# Patient Record
Sex: Male | Born: 1975
Health system: Southern US, Community
[De-identification: ages and names within clinical notes are randomized; demographics above are authoritative.]

---

## 2014-02-11 ENCOUNTER — Inpatient Hospital Stay (HOSPITAL_COMMUNITY)
Admission: EM | Admit: 2014-02-11 | Discharge: 2014-02-13 | DRG: 340 | Disposition: A | Discharge status: Home / Self Care | Payer: Self-pay | Attending: General Surgery | Admitting: General Surgery

## 2014-02-11 ENCOUNTER — Encounter (HOSPITAL_COMMUNITY): Admission: EM | Disposition: A | Discharge status: Home / Self Care | Payer: Self-pay | Source: Home / Self Care

## 2014-02-11 ENCOUNTER — Emergency Department (HOSPITAL_COMMUNITY): Payer: Self-pay

## 2014-02-11 ENCOUNTER — Encounter (HOSPITAL_COMMUNITY): Payer: Self-pay | Admitting: Emergency Medicine

## 2014-02-11 DIAGNOSIS — K3580 Unspecified acute appendicitis: Secondary | ICD-10-CM

## 2014-02-11 DIAGNOSIS — F17200 Nicotine dependence, unspecified, uncomplicated: Secondary | ICD-10-CM | POA: Diagnosis present

## 2014-02-11 DIAGNOSIS — K3533 Acute appendicitis with perforation and localized peritonitis, with abscess: Principal | ICD-10-CM | POA: Diagnosis present

## 2014-02-11 HISTORY — PX: LAPAROSCOPIC APPENDECTOMY: SHX408

## 2014-02-11 LAB — COMPREHENSIVE METABOLIC PANEL
ALT: 7 U/L (ref 0–53)
AST: 12 U/L (ref 0–37)
Albumin: 3.6 g/dL (ref 3.5–5.2)
Alkaline Phosphatase: 56 U/L (ref 39–117)
Anion gap: 12 (ref 5–15)
BUN: 6 mg/dL (ref 6–23)
CO2: 23 meq/L (ref 19–32)
Calcium: 8.9 mg/dL (ref 8.4–10.5)
Chloride: 101 mEq/L (ref 96–112)
Creatinine, Ser: 1.13 mg/dL (ref 0.50–1.35)
GFR calc Af Amer: >90 mL/min (ref >90)
GFR, EST NON AFRICAN AMERICAN: 82 mL/min — AB (ref >90)
Glucose, Bld: 101 mg/dL — ABNORMAL HIGH (ref 70–99)
Potassium: 3.4 mEq/L — ABNORMAL LOW (ref 3.7–5.3)
SODIUM: 136 meq/L — AB (ref 137–147)
TOTAL PROTEIN: 7 g/dL (ref 6.0–8.3)
Total Bilirubin: 1.7 mg/dL — ABNORMAL HIGH (ref 0.3–1.2)

## 2014-02-11 LAB — CBC WITH DIFFERENTIAL/PLATELET
BASOS ABS: 0 10*3/uL (ref 0.0–0.1)
Basophils Relative: 0 % (ref 0–1)
Eosinophils Absolute: 0 10*3/uL (ref 0.0–0.7)
Eosinophils Relative: 0 % (ref 0–5)
HCT: 39.4 % (ref 39.0–52.0)
Hemoglobin: 13.8 g/dL (ref 13.0–17.0)
LYMPHS PCT: 13 % (ref 12–46)
Lymphs Abs: 1.6 10*3/uL (ref 0.7–4.0)
MCH: 31.3 pg (ref 26.0–34.0)
MCHC: 35 g/dL (ref 30.0–36.0)
MCV: 89.3 fL (ref 78.0–100.0)
Monocytes Absolute: 1.2 10*3/uL — ABNORMAL HIGH (ref 0.1–1.0)
Monocytes Relative: 9 % (ref 3–12)
Neutro Abs: 10.2 10*3/uL — ABNORMAL HIGH (ref 1.7–7.7)
Neutrophils Relative %: 78 % — ABNORMAL HIGH (ref 43–77)
Platelets: 216 10*3/uL (ref 150–400)
RBC: 4.41 MIL/uL (ref 4.22–5.81)
RDW: 12.3 % (ref 11.5–15.5)
WBC: 13.1 10*3/uL — AB (ref 4.0–10.5)

## 2014-02-11 LAB — URINALYSIS, ROUTINE W REFLEX MICROSCOPIC
Bilirubin Urine: NEGATIVE
GLUCOSE, UA: NEGATIVE mg/dL
HGB URINE DIPSTICK: NEGATIVE
Ketones, ur: 40 mg/dL — AB
LEUKOCYTES UA: NEGATIVE
Nitrite: NEGATIVE
PH: 6 (ref 5.0–8.0)
Protein, ur: NEGATIVE mg/dL
SPECIFIC GRAVITY, URINE: 1.009 (ref 1.005–1.030)
Urobilinogen, UA: 1 mg/dL (ref 0.0–1.0)

## 2014-02-11 SURGERY — APPENDECTOMY, LAPAROSCOPIC
Anesthesia: General | Site: Abdomen

## 2014-02-11 MED ORDER — ONDANSETRON HCL 4 MG/2ML IJ SOLN
4.0000 mg | Freq: Once | INTRAMUSCULAR | Status: AC
  Administered 2014-02-11: 4 mg via INTRAVENOUS
  Filled 2014-02-11: qty 2

## 2014-02-11 MED ORDER — NEOSTIGMINE METHYLSULFATE 10 MG/10ML IV SOLN
INTRAVENOUS | Status: AC
  Filled 2014-02-11: qty 1

## 2014-02-11 MED ORDER — GLYCOPYRROLATE 0.2 MG/ML IJ SOLN
INTRAMUSCULAR | Status: AC
  Filled 2014-02-11: qty 2

## 2014-02-11 MED ORDER — ROCURONIUM BROMIDE 50 MG/5ML IV SOLN
INTRAVENOUS | Status: AC
  Filled 2014-02-11: qty 1

## 2014-02-11 MED ORDER — SUFENTANIL CITRATE 50 MCG/ML IV SOLN
INTRAVENOUS | Status: AC
  Filled 2014-02-11: qty 1

## 2014-02-11 MED ORDER — ACETAMINOPHEN 650 MG RE SUPP
650.0000 mg | Freq: Four times a day (QID) | RECTAL | Status: DC | PRN
Start: 2014-02-11 — End: 2014-02-13
  Filled 2014-02-11: qty 1

## 2014-02-11 MED ORDER — ONDANSETRON HCL 4 MG/2ML IJ SOLN
4.0000 mg | Freq: Four times a day (QID) | INTRAMUSCULAR | Status: DC | PRN
  Filled 2014-02-11: qty 2

## 2014-02-11 MED ORDER — ONDANSETRON HCL 4 MG/2ML IJ SOLN
INTRAMUSCULAR | Status: AC
  Filled 2014-02-11: qty 2

## 2014-02-11 MED ORDER — OXYCODONE HCL 5 MG PO TABS
5.0000 mg | ORAL_TABLET | ORAL | Status: DC | PRN
  Administered 2014-02-12: 5 mg via ORAL
  Filled 2014-02-11: qty 1

## 2014-02-11 MED ORDER — ACETAMINOPHEN 325 MG PO TABS
650.0000 mg | ORAL_TABLET | Freq: Four times a day (QID) | ORAL | Status: DC | PRN
Start: 2014-02-11 — End: 2014-02-13
  Filled 2014-02-11: qty 2

## 2014-02-11 MED ORDER — ENOXAPARIN SODIUM 40 MG/0.4ML ~~LOC~~ SOLN
40.0000 mg | SUBCUTANEOUS | Status: DC
  Administered 2014-02-12 – 2014-02-13 (×2): 40 mg via SUBCUTANEOUS
  Filled 2014-02-11 (×4): qty 0.4

## 2014-02-11 MED ORDER — SODIUM CHLORIDE 0.9 % IJ SOLN
INTRAMUSCULAR | Status: AC
  Filled 2014-02-11: qty 10

## 2014-02-11 MED ORDER — MORPHINE SULFATE 4 MG/ML IJ SOLN
4.0000 mg | Freq: Once | INTRAMUSCULAR | Status: AC
  Administered 2014-02-11: 4 mg via INTRAVENOUS
  Filled 2014-02-11: qty 1

## 2014-02-11 MED ORDER — SUCCINYLCHOLINE CHLORIDE 20 MG/ML IJ SOLN
INTRAMUSCULAR | Status: AC
  Filled 2014-02-11: qty 1

## 2014-02-11 MED ORDER — LIDOCAINE HCL (CARDIAC) 20 MG/ML IV SOLN
INTRAVENOUS | Status: AC
  Filled 2014-02-11: qty 5

## 2014-02-11 MED ORDER — HYDROMORPHONE HCL PF 1 MG/ML IJ SOLN: 1.0000 mg | INTRAMUSCULAR | Status: DC | PRN

## 2014-02-11 MED ORDER — DEXTROSE-NACL 5-0.9 % IV SOLN
INTRAVENOUS | Status: DC
  Administered 2014-02-12 (×2): via INTRAVENOUS

## 2014-02-11 MED ORDER — DEXTROSE 5 % IV SOLN
2.0000 g | INTRAVENOUS | Status: DC
  Filled 2014-02-11: qty 2

## 2014-02-11 MED ORDER — MIDAZOLAM HCL 2 MG/2ML IJ SOLN
INTRAMUSCULAR | Status: AC
  Filled 2014-02-11: qty 2

## 2014-02-11 MED ORDER — BUPIVACAINE-EPINEPHRINE (PF) 0.5% -1:200000 IJ SOLN
INTRAMUSCULAR | Status: AC
  Filled 2014-02-11: qty 30

## 2014-02-11 MED ORDER — PROPOFOL 10 MG/ML IV BOLUS
INTRAVENOUS | Status: AC
Start: 2014-02-11 — End: 2014-02-11
  Filled 2014-02-11: qty 20

## 2014-02-11 MED ORDER — SODIUM CHLORIDE 0.9 % IV BOLUS (SEPSIS)
1000.0000 mL | Freq: Once | INTRAVENOUS | Status: AC
  Administered 2014-02-11: 1000 mL via INTRAVENOUS

## 2014-02-11 MED ORDER — IOHEXOL 300 MG/ML  SOLN
100.0000 mL | Freq: Once | INTRAMUSCULAR | Status: AC | PRN
  Administered 2014-02-11: 100 mL via INTRAVENOUS

## 2014-02-11 SURGICAL SUPPLY — 41 items
APPLIER CLIP ROT 10 11.4 M/L (STAPLE)
BLADE SURG ROTATE 9660 (MISCELLANEOUS) ×3 IMPLANT
CANISTER SUCTION 2500CC (MISCELLANEOUS) ×3 IMPLANT
CHLORAPREP W/TINT 26ML (MISCELLANEOUS) ×3 IMPLANT
CLIP APPLIE ROT 10 11.4 M/L (STAPLE) IMPLANT
COVER SURGICAL LIGHT HANDLE (MISCELLANEOUS) ×3 IMPLANT
CUTTER FLEX LINEAR 45M (STAPLE) ×3 IMPLANT
DERMABOND ADHESIVE PROPEN (GAUZE/BANDAGES/DRESSINGS) ×2
DERMABOND ADVANCED (GAUZE/BANDAGES/DRESSINGS) ×2
DERMABOND ADVANCED .7 DNX12 (GAUZE/BANDAGES/DRESSINGS) ×1 IMPLANT
DERMABOND ADVANCED .7 DNX6 (GAUZE/BANDAGES/DRESSINGS) ×1 IMPLANT
DRAPE UTILITY 15X26 W/TAPE STR (DRAPE) ×6 IMPLANT
DRAPE WARM FLUID 44X44 (DRAPE) ×3 IMPLANT
ELECT REM PT RETURN 9FT ADLT (ELECTROSURGICAL) ×3
ELECTRODE REM PT RTRN 9FT ADLT (ELECTROSURGICAL) ×1 IMPLANT
ENDOLOOP SUT PDS II  0 18 (SUTURE)
ENDOLOOP SUT PDS II 0 18 (SUTURE) IMPLANT
GLOVE BIO SURGEON STRL SZ8 (GLOVE) ×3 IMPLANT
GLOVE BIOGEL PI IND STRL 8 (GLOVE) ×1 IMPLANT
GLOVE BIOGEL PI INDICATOR 8 (GLOVE) ×2
GOWN STRL REUS W/ TWL LRG LVL3 (GOWN DISPOSABLE) ×1 IMPLANT
GOWN STRL REUS W/ TWL XL LVL3 (GOWN DISPOSABLE) ×1 IMPLANT
GOWN STRL REUS W/TWL LRG LVL3 (GOWN DISPOSABLE) ×2
GOWN STRL REUS W/TWL XL LVL3 (GOWN DISPOSABLE) ×2
KIT BASIN OR (CUSTOM PROCEDURE TRAY) ×3 IMPLANT
KIT ROOM TURNOVER OR (KITS) ×3 IMPLANT
NS IRRIG 1000ML POUR BTL (IV SOLUTION) ×3 IMPLANT
PAD ARMBOARD 7.5X6 YLW CONV (MISCELLANEOUS) ×6 IMPLANT
POUCH SPECIMEN RETRIEVAL 10MM (ENDOMECHANICALS) ×3 IMPLANT
RELOAD STAPLE TA45 3.5 REG BLU (ENDOMECHANICALS) ×3 IMPLANT
SCALPEL HARMONIC ACE (MISCELLANEOUS) ×3 IMPLANT
SET IRRIG TUBING LAPAROSCOPIC (IRRIGATION / IRRIGATOR) ×3 IMPLANT
SPECIMEN JAR SMALL (MISCELLANEOUS) ×3 IMPLANT
SUT MON AB 4-0 PC3 18 (SUTURE) ×3 IMPLANT
TOWEL OR 17X24 6PK STRL BLUE (TOWEL DISPOSABLE) ×3 IMPLANT
TOWEL OR 17X26 10 PK STRL BLUE (TOWEL DISPOSABLE) ×3 IMPLANT
TRAY FOLEY CATH 16FR SILVER (SET/KITS/TRAYS/PACK) ×3 IMPLANT
TRAY LAPAROSCOPIC (CUSTOM PROCEDURE TRAY) ×3 IMPLANT
TROCAR XCEL BLADELESS 5X75MML (TROCAR) ×6 IMPLANT
TROCAR XCEL BLUNT TIP 100MML (ENDOMECHANICALS) ×3 IMPLANT
TUBING INSUFF HIGH FLOW RTP (TUBING) ×3 IMPLANT

## 2014-02-11 NOTE — ED Notes (Signed)
Patient returned from CT

## 2014-02-11 NOTE — H&P (Signed)
Wayne Chang is an 38 y.o. male.   Chief Complaint: RLQ abdominal pain   HPI: pt presents with 3 day hx of abdominal pain.  Started as diffuse crampy pain now is located in RLQ and is constant crampy 8/10 made worse with movement.  Feel week and has malaise.  NO BACK PAIN OR DYSURIA.Marland Kitchen  CT shows appendicitis with possible perforation.    History reviewed. No pertinent past medical history.  History reviewed. No pertinent past surgical history.  History reviewed. No pertinent family history. Social History:  reports that he has been smoking Cigarettes.  He has been smoking about 0.00 packs per day. He does not have any smokeless tobacco history on file. He reports that he uses illicit drugs (Marijuana). He reports that he does not drink alcohol.  Allergies:  Allergies  Allergen Reactions  . Peanut-Containing Drug Products      (Not in a hospital admission)  Results for orders placed during the hospital encounter of 02/11/14 (from the past 48 hour(s))  CBC WITH DIFFERENTIAL     Status: Abnormal   Collection Time    02/11/14  7:07 PM      Result Value Ref Range   WBC 13.1 (*) 4.0 - 10.5 K/uL   RBC 4.41  4.22 - 5.81 MIL/uL   Hemoglobin 13.8  13.0 - 17.0 g/dL   HCT 39.4  39.0 - 52.0 %   MCV 89.3  78.0 - 100.0 fL   MCH 31.3  26.0 - 34.0 pg   MCHC 35.0  30.0 - 36.0 g/dL   RDW 12.3  11.5 - 15.5 %   Platelets 216  150 - 400 K/uL   Neutrophils Relative % 78 (*) 43 - 77 %   Neutro Abs 10.2 (*) 1.7 - 7.7 K/uL   Lymphocytes Relative 13  12 - 46 %   Lymphs Abs 1.6  0.7 - 4.0 K/uL   Monocytes Relative 9  3 - 12 %   Monocytes Absolute 1.2 (*) 0.1 - 1.0 K/uL   Eosinophils Relative 0  0 - 5 %   Eosinophils Absolute 0.0  0.0 - 0.7 K/uL   Basophils Relative 0  0 - 1 %   Basophils Absolute 0.0  0.0 - 0.1 K/uL  COMPREHENSIVE METABOLIC PANEL     Status: Abnormal   Collection Time    02/11/14  7:07 PM      Result Value Ref Range   Sodium 136 (*) 137 - 147 mEq/L   Potassium 3.4 (*) 3.7  - 5.3 mEq/L   Chloride 101  96 - 112 mEq/L   CO2 23  19 - 32 mEq/L   Glucose, Bld 101 (*) 70 - 99 mg/dL   BUN 6  6 - 23 mg/dL   Creatinine, Ser 1.13  0.50 - 1.35 mg/dL   Calcium 8.9  8.4 - 10.5 mg/dL   Total Protein 7.0  6.0 - 8.3 g/dL   Albumin 3.6  3.5 - 5.2 g/dL   AST 12  0 - 37 U/L   ALT 7  0 - 53 U/L   Alkaline Phosphatase 56  39 - 117 U/L   Total Bilirubin 1.7 (*) 0.3 - 1.2 mg/dL   GFR calc non Af Amer 82 (*) >90 mL/min   GFR calc Af Amer >90  >90 mL/min   Comment: (NOTE)     The eGFR has been calculated using the CKD EPI equation.     This calculation has not been validated in all  clinical situations.     eGFR's persistently <90 mL/min signify possible Chronic Kidney     Disease.   Anion gap 12  5 - 15  URINALYSIS, ROUTINE W REFLEX MICROSCOPIC     Status: Abnormal   Collection Time    02/11/14  9:04 PM      Result Value Ref Range   Color, Urine YELLOW  YELLOW   APPearance CLEAR  CLEAR   Specific Gravity, Urine 1.009  1.005 - 1.030   pH 6.0  5.0 - 8.0   Glucose, UA NEGATIVE  NEGATIVE mg/dL   Hgb urine dipstick NEGATIVE  NEGATIVE   Bilirubin Urine NEGATIVE  NEGATIVE   Ketones, ur 40 (*) NEGATIVE mg/dL   Protein, ur NEGATIVE  NEGATIVE mg/dL   Urobilinogen, UA 1.0  0.0 - 1.0 mg/dL   Nitrite NEGATIVE  NEGATIVE   Leukocytes, UA NEGATIVE  NEGATIVE   Comment: MICROSCOPIC NOT DONE ON URINES WITH NEGATIVE PROTEIN, BLOOD, LEUKOCYTES, NITRITE, OR GLUCOSE <1000 mg/dL.   Ct Abdomen Pelvis W Contrast  02/11/2014   CLINICAL DATA:  Lower abdominal pain and cramping.  EXAM: CT ABDOMEN AND PELVIS WITH CONTRAST  TECHNIQUE: Multidetector CT imaging of the abdomen and pelvis was performed using the standard protocol following bolus administration of intravenous contrast.  CONTRAST:  18m OMNIPAQUE IOHEXOL 300 MG/ML  SOLN  COMPARISON:  None.  FINDINGS: The patient has acute appendicitis with extensive periappendiceal soft tissue inflammation with a small amount of fluid in the right  pericolic gutter extending to the right side of the pelvis. There is a small fluid collection adjacent to the tip of inflamed appendix which may represent a small periappendiceal abscess.  Liver, biliary tree, spleen, pancreas, adrenal glands, and kidneys are normal. No dilated bowel. Bladder is normal. No acute osseous abnormality.  IMPRESSION: Acute appendicitis. There may be a 1 cm periappendiceal abscess adjacent to the tip of the appendix.   Electronically Signed   By: JRozetta NunneryM.D.   On: 02/11/2014 22:36    Review of Systems  Constitutional: Positive for malaise/fatigue.  HENT: Negative.   Eyes: Negative.   Respiratory: Negative.   Cardiovascular: Negative.   Gastrointestinal: Positive for nausea and abdominal pain.  Genitourinary: Negative.   Musculoskeletal: Negative.   Skin: Negative.   Neurological: Positive for weakness. Negative for dizziness and tingling.  Endo/Heme/Allergies: Negative.   Psychiatric/Behavioral: Negative.     Blood pressure 112/65, pulse 60, temperature 99.2 F (37.3 C), temperature source Oral, resp. rate 18, SpO2 98.00%. Physical Exam  Constitutional: He is oriented to person, place, and time. He appears well-developed and well-nourished.  HENT:  Head: Normocephalic and atraumatic.  Eyes: Pupils are equal, round, and reactive to light. No scleral icterus.  Neck: Normal range of motion.  Cardiovascular: Normal rate and regular rhythm.   Respiratory: Effort normal and breath sounds normal.  GI: There is tenderness in the right lower quadrant. There is rigidity, rebound, guarding and tenderness at McBurney's point.  Musculoskeletal: Normal range of motion.  Neurological: He is alert and oriented to person, place, and time.  Skin: Skin is warm and dry.  Psychiatric: He has a normal mood and affect. His behavior is normal. Judgment and thought content normal.     Assessment/Plan Acute appendicitis with possible 1 cm abscess Appears toxic Will  proceed to OR tonight for laparoscopic possible open  Appendectomy  Since pt appears quite ill and has been for the last 3 days.   The procedure has been discussed with  the patient.  Alternative therapies have been discussed with the patient.  Operative risks include bleeding,  Infection,  Organ injury,  Nerve injury,  Blood vessel injury,  DVT,  Pulmonary embolism,  Death,  And possible reoperation.  Medical management risks include worsening of present situation.  The success of the procedure is 50 -90 % at treating patients symptoms.  The patient understands and agrees to proceed.  Aylen Stradford A. 02/11/2014, 11:07 PM

## 2014-02-11 NOTE — ED Provider Notes (Signed)
CSN: [id number]     Arrival date & time 02/11/14  1851 History   First MD Initiated Contact with Patient 02/11/14 1939     Chief Complaint  Patient presents with  . Abdominal Pain     (Consider location/radiation/quality/duration/timing/severity/associated sxs/prior Treatment) HPI Comments: Patient is a 38 year old otherwise healthy male who presents to the ED for evaluation of 3 days of lower abdominal pain and cramping. He reports initially the pain was generalized and now is worse in his right lower quadrant. The pain acutely worsened today. He denies nausea, vomiting, diarrhea. He has loss of appetite, but notes increased polydipsia. He has associated chills.   Patient is a 38 y.o. male presenting with abdominal pain. The history is provided by the patient. No language interpreter was used.  Abdominal Pain Associated symptoms: chills   Associated symptoms: no chest pain, no constipation, no diarrhea, no dysuria, no fever, no nausea, no shortness of breath and no vomiting     History reviewed. No pertinent past medical history. History reviewed. No pertinent past surgical history. History reviewed. No pertinent family history. History  Substance Use Topics  . Smoking status: Current Every Day Smoker    Types: Cigarettes  . Smokeless tobacco: Not on file  . Alcohol Use: No    Review of Systems  Constitutional: Positive for chills. Negative for fever.  Respiratory: Negative for shortness of breath.   Cardiovascular: Negative for chest pain.  Gastrointestinal: Positive for abdominal pain. Negative for nausea, vomiting, diarrhea and constipation.  Endocrine: Positive for polydipsia.  Genitourinary: Negative for dysuria.  All other systems reviewed and are negative.     Allergies  Peanut-containing drug products  Home Medications   Prior to Admission medications   Not on File   BP 135/72  Pulse 90  Temp(Src) 99.2 F (37.3 C) (Oral)  Resp 18  SpO2 100% Physical  Exam  Nursing note and vitals reviewed. Constitutional: He is oriented to person, place, and time. He appears well-developed and well-nourished. No distress.  HENT:  Head: Normocephalic and atraumatic.  Right Ear: External ear normal.  Left Ear: External ear normal.  Nose: Nose normal.  Eyes: Conjunctivae are normal.  Neck: Normal range of motion. No tracheal deviation present.  Cardiovascular: Normal rate, regular rhythm and normal heart sounds.   Pulmonary/Chest: Effort normal and breath sounds normal. No stridor.  Abdominal: Soft. He exhibits no distension. There is tenderness in the right lower quadrant.  Musculoskeletal: Normal range of motion.  Neurological: He is alert and oriented to person, place, and time.  Skin: Skin is warm and dry. He is not diaphoretic.  Psychiatric: He has a normal mood and affect. His behavior is normal.    ED Course  Procedures (including critical care time) Labs Review Labs Reviewed  CBC WITH DIFFERENTIAL - Abnormal; Notable for the following:    WBC 13.1 (*)    Neutrophils Relative % 78 (*)    Neutro Abs 10.2 (*)    Monocytes Absolute 1.2 (*)    All other components within normal limits  COMPREHENSIVE METABOLIC PANEL - Abnormal; Notable for the following:    Sodium 136 (*)    Potassium 3.4 (*)    Glucose, Bld 101 (*)    Total Bilirubin 1.7 (*)    GFR calc non Af Amer 82 (*)    All other components within normal limits  URINALYSIS, ROUTINE W REFLEX MICROSCOPIC - Abnormal; Notable for the following:    Ketones, ur 40 (*)  All other components within normal limits    Imaging Review Ct Abdomen Pelvis W Contrast  02/11/2014   CLINICAL DATA:  Lower abdominal pain and cramping.  EXAM: CT ABDOMEN AND PELVIS WITH CONTRAST  TECHNIQUE: Multidetector CT imaging of the abdomen and pelvis was performed using the standard protocol following bolus administration of intravenous contrast.  CONTRAST:  OMNIPAQUE IOHEXOL 300 MG/ML  SOLN  COMPARISON:   None.  FINDINGS: The patient has acute appendicitis with extensive periappendiceal soft tissue inflammation with a small amount of fluid in the right pericolic gutter extending to the right side of the pelvis. There is a small fluid collection adjacent to the tip of inflamed appendix which may represent a small periappendiceal abscess.  Liver, biliary tree, spleen, pancreas, adrenal glands, and kidneys are normal. No dilated bowel. Bladder is normal. No acute osseous abnormality.  IMPRESSION: Acute appendicitis. There may be a 1 cm periappendiceal abscess adjacent to the tip of the appendix.   Electronically Signed   By: Geanie Cooley M.D.   On: 02/11/2014 22:36     EKG Interpretation None      MDM   Final diagnoses:  Acute appendicitis, unspecified acute appendicitis type   Patient presents emergency Department with acute appendicitis per CT scan. There may be a 1 cm periappendiceal abscess adjacent to the tip of the appendix. Patient is otherwise healthy. Hemodynamically stable. He last ate yesterday. He had some sips of water prior to arrival. Discussed case with Dr. Luisa Hart who will admit the patient. Consultation is appreciated. Patient / Family / Caregiver informed of clinical course, understand medical decision-making process, and agree with plan.    Mora Bellman, PA-C 02/11/14 2248

## 2014-02-11 NOTE — ED Provider Notes (Signed)
Medical screening examination/treatment/procedure(s) were performed by non-physician practitioner and as supervising physician I was immediately available for consultation/collaboration.   EKG Interpretation None       Kaisei Gilbo R. Batya Citron, MD 02/11/14 2316 

## 2014-02-11 NOTE — ED Notes (Signed)
Pt is aware he needs a urine sample 

## 2014-02-11 NOTE — ED Notes (Signed)
Notified CT; patient done with contrast. 

## 2014-02-11 NOTE — ED Notes (Addendum)
Pt reports having lower abd pain and cramping x 3 days. Denies n/v/d. Denies urinary symptoms.

## 2014-02-11 NOTE — ED Notes (Signed)
General Surgery, MD at bedside ([place]).

## 2014-02-11 NOTE — ED Notes (Signed)
Patient transported to CT 

## 2014-02-12 ENCOUNTER — Encounter (HOSPITAL_COMMUNITY): Payer: Self-pay | Admitting: Certified Registered"

## 2014-02-12 ENCOUNTER — Encounter (HOSPITAL_COMMUNITY): Payer: Self-pay | Admitting: Surgery

## 2014-02-12 ENCOUNTER — Observation Stay (HOSPITAL_COMMUNITY): Payer: Self-pay | Admitting: Certified Registered"

## 2014-02-12 DIAGNOSIS — K3533 Acute appendicitis with perforation and localized peritonitis, with abscess: Secondary | ICD-10-CM | POA: Diagnosis present

## 2014-02-12 MED ORDER — METRONIDAZOLE IN NACL 5-0.79 MG/ML-% IV SOLN
500.0000 mg | Freq: Three times a day (TID) | INTRAVENOUS | Status: DC
  Administered 2014-02-12 – 2014-02-13 (×4): 500 mg via INTRAVENOUS
  Filled 2014-02-12 (×8): qty 100

## 2014-02-12 MED ORDER — GLYCOPYRROLATE 0.2 MG/ML IJ SOLN
INTRAMUSCULAR | Status: DC | PRN
  Administered 2014-02-12: 0.4 mg via INTRAVENOUS
  Administered 2014-02-12: 0.2 mg via INTRAVENOUS

## 2014-02-12 MED ORDER — 0.9 % SODIUM CHLORIDE (POUR BTL) OPTIME
TOPICAL | Status: DC | PRN
  Administered 2014-02-12: 1000 mL

## 2014-02-12 MED ORDER — ONDANSETRON HCL 4 MG/2ML IJ SOLN: 4.0000 mg | Freq: Four times a day (QID) | INTRAMUSCULAR | Status: DC | PRN

## 2014-02-12 MED ORDER — PROPOFOL 10 MG/ML IV BOLUS
INTRAVENOUS | Status: DC | PRN
  Administered 2014-02-12: 150 mg via INTRAVENOUS

## 2014-02-12 MED ORDER — OXYCODONE HCL 5 MG PO TABS: 5.0000 mg | ORAL_TABLET | Freq: Once | ORAL | Status: DC | PRN

## 2014-02-12 MED ORDER — OXYCODONE HCL 5 MG/5ML PO SOLN: 5.0000 mg | Freq: Once | ORAL | Status: DC | PRN

## 2014-02-12 MED ORDER — BUPIVACAINE-EPINEPHRINE 0.5% -1:200000 IJ SOLN
INTRAMUSCULAR | Status: DC | PRN
  Administered 2014-02-12: 4 mL

## 2014-02-12 MED ORDER — NEOSTIGMINE METHYLSULFATE 10 MG/10ML IV SOLN
INTRAVENOUS | Status: DC | PRN
  Administered 2014-02-12: 3 mg via INTRAVENOUS

## 2014-02-12 MED ORDER — SODIUM CHLORIDE 0.9 % IR SOLN
Status: DC | PRN
  Administered 2014-02-12: 1000 mL

## 2014-02-12 MED ORDER — MIDAZOLAM HCL 5 MG/5ML IJ SOLN
INTRAMUSCULAR | Status: DC | PRN
  Administered 2014-02-12: 2 mg via INTRAVENOUS

## 2014-02-12 MED ORDER — LACTATED RINGERS IV SOLN
INTRAVENOUS | Status: DC | PRN
  Administered 2014-02-12 (×2): via INTRAVENOUS

## 2014-02-12 MED ORDER — LIDOCAINE HCL (CARDIAC) 20 MG/ML IV SOLN
INTRAVENOUS | Status: DC | PRN
  Administered 2014-02-12: 100 mg via INTRAVENOUS

## 2014-02-12 MED ORDER — DEXAMETHASONE SODIUM PHOSPHATE 4 MG/ML IJ SOLN
INTRAMUSCULAR | Status: DC | PRN
  Administered 2014-02-12: 4 mg via INTRAVENOUS

## 2014-02-12 MED ORDER — ROCURONIUM BROMIDE 100 MG/10ML IV SOLN
INTRAVENOUS | Status: DC | PRN
  Administered 2014-02-12: 30 mg via INTRAVENOUS

## 2014-02-12 MED ORDER — HYDROMORPHONE HCL PF 1 MG/ML IJ SOLN
INTRAMUSCULAR | Status: AC
  Filled 2014-02-12: qty 1

## 2014-02-12 MED ORDER — CEFOTETAN DISODIUM 2 G IJ SOLR
2.0000 g | INTRAMUSCULAR | Status: DC | PRN
  Administered 2014-02-12: 2 g via INTRAVENOUS

## 2014-02-12 MED ORDER — GLYCOPYRROLATE 0.2 MG/ML IJ SOLN
INTRAMUSCULAR | Status: AC
  Filled 2014-02-12: qty 1

## 2014-02-12 MED ORDER — SUCCINYLCHOLINE CHLORIDE 20 MG/ML IJ SOLN
INTRAMUSCULAR | Status: DC | PRN
  Administered 2014-02-12: 100 mg via INTRAVENOUS

## 2014-02-12 MED ORDER — SUFENTANIL CITRATE 50 MCG/ML IV SOLN
INTRAVENOUS | Status: DC | PRN
  Administered 2014-02-12: 20 ug via INTRAVENOUS
  Administered 2014-02-12 (×2): 10 ug via INTRAVENOUS

## 2014-02-12 MED ORDER — ONDANSETRON HCL 4 MG/2ML IJ SOLN
INTRAMUSCULAR | Status: DC | PRN
  Administered 2014-02-12: 4 mg via INTRAVENOUS

## 2014-02-12 MED ORDER — DEXTROSE 5 % IV SOLN
1.0000 g | INTRAVENOUS | Status: DC
  Administered 2014-02-12 – 2014-02-13 (×2): 1 g via INTRAVENOUS
  Filled 2014-02-12 (×2): qty 10

## 2014-02-12 MED ORDER — HYDROMORPHONE HCL PF 1 MG/ML IJ SOLN
0.2500 mg | INTRAMUSCULAR | Status: DC | PRN
  Administered 2014-02-12: 0.5 mg via INTRAVENOUS

## 2014-02-12 NOTE — Progress Notes (Signed)
Patient ID: Wayne Chang, male   DOB: 1975/08/19, 38 y.o.   MRN: [id number]     Wayne Chang      [address] [place], [place] [address]    Phone: [phone] FAX: [phone]     Subjective: No N/V.  No flatus.  Voiding.  Has not had anything PO.    Objective:  Vital signs:  Filed Vitals:   02/12/14 0222 02/12/14 0225 02/12/14 0239 02/12/14 0615  BP: 131/67 126/65 131/67 135/74  Pulse: 74 76 82 76  Temp: 99.2 F (37.3 C)  99.7 F (37.6 C) 99.4 F (37.4 C)  TempSrc:   Oral Oral  Resp: _0 Height:      Weight:      SpO2: 100% 100% 100% 100%       Intake/Output   Yesterday:  09/13 0701 - 09/14 0700 In: 1000 [I.V.:1000] Out: 100 [Urine:100] This shift:    I/O last 3 completed shifts: In: 1000 [I.V.:1000] Out: 100 [Urine:100]   Physical Exam: General: Pt awake/alert/oriented x4 in no acute distress Chest: cta.  No chest wall pain w good excursion CV:  Pulses intact.  Regular rhythm Abdomen: Soft.  Nondistended.  +BS.  Mildly tender at incisions only.  Incisions are c/d/i.  No evidence of peritonitis.  No incarcerated hernias. Ext: No mjr edema.  No cyanosis Skin: No petechiae / purpura   Problem List:   Active Problems:   Acute appendicitis with peritoneal abscess    Results:   Labs: Results for orders placed during the hospital encounter of 02/11/14 (from the past 48 hour(s))  CBC WITH DIFFERENTIAL     Status: Abnormal   Collection Time    02/11/14  7:07 PM      Result Value Ref Range   WBC 13.1 (*) 4.0 - 10.5 K/uL   RBC 4.41  4.22 - 5.81 MIL/uL   Hemoglobin 13.8  13.0 - 17.0 g/dL   HCT 39.4  39.0 - 52.0 %   MCV 89.3  78.0 - 100.0 fL   MCH 31.3  26.0 - 34.0 pg   MCHC 35.0  30.0 - 36.0 g/dL   RDW 12.3  11.5 - 15.5 %   Platelets 216  150 - 400 K/uL   Neutrophils Relative % 78 (*) 43 - 77 %   Neutro Abs 10.2 (*) 1.7 - 7.7 K/uL   Lymphocytes Relative 13  12 - 46 %   Lymphs Abs 1.6   0.7 - 4.0 K/uL   Monocytes Relative 9  3 - 12 %   Monocytes Absolute 1.2 (*) 0.1 - 1.0 K/uL   Eosinophils Relative 0  0 - 5 %   Eosinophils Absolute 0.0  0.0 - 0.7 K/uL   Basophils Relative 0  0 - 1 %   Basophils Absolute 0.0  0.0 - 0.1 K/uL  COMPREHENSIVE METABOLIC PANEL     Status: Abnormal   Collection Time    02/11/14  7:07 PM      Result Value Ref Range   Sodium 136 (*) 137 - 147 mEq/L   Potassium 3.4 (*) 3.7 - 5.3 mEq/L   Chloride 101  96 - 112 mEq/L   CO2 23  19 - 32 mEq/L   Glucose, Bld 101 (*) 70 - 99 mg/dL   BUN 6  6 - 23 mg/dL   Creatinine, Ser 1.13  0.50 - 1.35 mg/dL   Calcium 8.9  8.4 - 10.5 mg/dL  Total Protein 7.0  6.0 - 8.3 g/dL   Albumin 3.6  3.5 - 5.2 g/dL   AST 12  0 - 37 U/L   ALT 7  0 - 53 U/L   Alkaline Phosphatase 56  39 - 117 U/L   Total Bilirubin 1.7 (*) 0.3 - 1.2 mg/dL   GFR calc non Af Amer 82 (*) >90 mL/min   GFR calc Af Amer >90  >90 mL/min   Comment: (NOTE)     The eGFR has been calculated using the CKD EPI equation.     This calculation has not been validated in all clinical situations.     eGFR's persistently <90 mL/min signify possible Chronic Kidney     Disease.   Anion gap 12  5 - 15  URINALYSIS, ROUTINE W REFLEX MICROSCOPIC     Status: Abnormal   Collection Time    02/11/14  9:04 PM      Result Value Ref Range   Color, Urine YELLOW  YELLOW   APPearance CLEAR  CLEAR   Specific Gravity, Urine 1.009  1.005 - 1.030   pH 6.0  5.0 - 8.0   Glucose, UA NEGATIVE  NEGATIVE mg/dL   Hgb urine dipstick NEGATIVE  NEGATIVE   Bilirubin Urine NEGATIVE  NEGATIVE   Ketones, ur 40 (*) NEGATIVE mg/dL   Protein, ur NEGATIVE  NEGATIVE mg/dL   Urobilinogen, UA 1.0  0.0 - 1.0 mg/dL   Nitrite NEGATIVE  NEGATIVE   Leukocytes, UA NEGATIVE  NEGATIVE   Comment: MICROSCOPIC NOT DONE ON URINES WITH NEGATIVE PROTEIN, BLOOD, LEUKOCYTES, NITRITE, OR GLUCOSE <1000 mg/dL.    Imaging / Studies: Ct Abdomen Pelvis W Contrast  02/11/2014   CLINICAL DATA:  Lower  abdominal pain and cramping.  EXAM: CT ABDOMEN AND PELVIS WITH CONTRAST  TECHNIQUE: Multidetector CT imaging of the abdomen and pelvis was performed using the standard protocol following bolus administration of intravenous contrast.  CONTRAST:  153m OMNIPAQUE IOHEXOL 300 MG/ML  SOLN  COMPARISON:  None.  FINDINGS: The patient has acute appendicitis with extensive periappendiceal soft tissue inflammation with a small amount of fluid in the right pericolic gutter extending to the right side of the pelvis. There is a small fluid collection adjacent to the tip of inflamed appendix which may represent a small periappendiceal abscess.  Liver, biliary tree, spleen, pancreas, adrenal glands, and kidneys are normal. No dilated bowel. Bladder is normal. No acute osseous abnormality.  IMPRESSION: Acute appendicitis. There may be a 1 cm periappendiceal abscess adjacent to the tip of the appendix.   Electronically Signed   By: JRozetta NunneryM.D.   On: 02/11/2014 22:36    Medications / Allergies:  Scheduled Meds: . cefTRIAXone (ROCEPHIN)  IV  1 g Intravenous Q24H  . enoxaparin (LOVENOX) injection  40 mg Subcutaneous Q24H  . HYDROmorphone      . metronidazole  500 mg Intravenous Q8H   Continuous Infusions: . dextrose 5 % and 0.9% NaCl     PRN Meds:.acetaminophen, acetaminophen, HYDROmorphone (DILAUDID) injection, ondansetron, oxyCODONE  Antibiotics: Anti-infectives   Start     Dose/Rate Route Frequency Ordered Stop   02/12/14 0600  cefOXitin (MEFOXIN) 2 g in dextrose 5 % 50 mL IVPB  Status:  Discontinued     2 g 100 mL/hr over 30 Minutes Intravenous On call to O.R. 02/11/14 2304 02/12/14 0254   02/12/14 0200  cefTRIAXone (ROCEPHIN) 1 g in dextrose 5 % 50 mL IVPB     1 g 100 mL/hr  over 30 Minutes Intravenous Every 24 hours 02/12/14 0148     02/12/14 0200  metroNIDAZOLE (FLAGYL) IVPB 500 mg     500 mg 100 mL/hr over 60 Minutes Intravenous Every 8 hours 02/12/14 0148          Assessment/Plan POD#1  laparoscopic appendectomy with peritoneal abscess -start clears and advance as tolerated -mobilize/IS -rocephin/flagyl, 10 days of PO atbx at discharge -SLIV when able to tolerate PO -pain control -SCD/lovenox  -anticipate discharge tomorrow   Erby Pian, [facility] Surgery Pager [phone](7A-4:30P) For consults and floor pages call [phone](7A-4:30P)  02/12/2014 8:45 AM

## 2014-02-12 NOTE — Progress Notes (Signed)
Utilization review completed.  

## 2014-02-12 NOTE — Anesthesia Postprocedure Evaluation (Signed)
Anesthesia Post Note  Patient: Wayne Chang  Procedure(s) Performed: Procedure(s) (LRB): APPENDECTOMY LAPAROSCOPIC (N/A)  Anesthesia type: General  Patient location: PACU  Post pain: Pain level controlled and Adequate analgesia  Post assessment: Post-op Vital signs reviewed, Patient's Cardiovascular Status Stable, Respiratory Function Stable, Patent Airway and Pain level controlled  Last Vitals:  Filed Vitals:   02/12/14 0615  BP: 135/74  Pulse: 76  Temp: 37.4 C  Resp: 16    Post vital signs: Reviewed and stable  Level of consciousness: awake, alert  and oriented  Complications: No apparent anesthesia complications

## 2014-02-12 NOTE — Anesthesia Preprocedure Evaluation (Signed)
Anesthesia Evaluation  Patient identified by MRN, date of birth, ID band Patient awake    Reviewed: Allergy & Precautions, H&P , NPO status , Patient's Chart, lab work & pertinent test results  Airway Mallampati: II  Neck ROM: full    Dental   Pulmonary Current Smoker,          Cardiovascular negative cardio ROS      Neuro/Psych    GI/Hepatic   Endo/Other    Renal/GU      Musculoskeletal   Abdominal   Peds  Hematology   Anesthesia Other Findings   Reproductive/Obstetrics                           Anesthesia Physical Anesthesia Plan  ASA: II and emergent  Anesthesia Plan: General   Post-op Pain Management:    Induction: Intravenous  Airway Management Planned: Oral ETT  Additional Equipment:   Intra-op Plan:   Post-operative Plan: Extubation in OR  Informed Consent: I have reviewed the patients History and Physical, chart, labs and discussed the procedure including the risks, benefits and alternatives for the proposed anesthesia with the patient or authorized representative who has indicated his/her understanding and acceptance.     Plan Discussed with: CRNA, Anesthesiologist and Surgeon  Anesthesia Plan Comments:         Anesthesia Quick Evaluation

## 2014-02-12 NOTE — Transfer of Care (Signed)
Immediate Anesthesia Transfer of Care Note  Patient: Wayne Chang  Procedure(s) Performed: Procedure(s): APPENDECTOMY LAPAROSCOPIC (N/A)  Patient Location: PACU  Anesthesia Type:General  Level of Consciousness: oriented, sedated, patient cooperative and responds to stimulation  Airway & Oxygen Therapy: Patient Spontanous Breathing and Patient connected to nasal cannula oxygen  Post-op Assessment: Report given to PACU RN, Post -op Vital signs reviewed and stable and Patient moving all extremities X 4  Post vital signs: Reviewed and stable  Complications: No apparent anesthesia complications

## 2014-02-12 NOTE — Progress Notes (Signed)
Needs 5-7 total days abx, agree with above

## 2014-02-12 NOTE — Op Note (Signed)
Appendectomy, Lap, Procedure Note  Indications: The patient presented with a history of right-sided abdominal pain. A CT revealed findings consistent with acute appendicitis.The procedure has been discussed with the patient.  Alternative therapies have been discussed with the patient.  Operative risks include bleeding,  Infection,  Organ injury,  Nerve injury,  Blood vessel injury,  DVT,  Pulmonary embolism,  Death,  And possible reoperation.  Medical management risks include worsening of present situation.  The success of the procedure is 50 -90 % at treating patients symptoms.  The patient understands and agrees to proceed.  Pre-operative Diagnosis: Acute appendicitis with peritoneal abscess  Post-operative Diagnosis: Same  Surgeon: Julies Carmickle A.   Assistants: or STAFF  Anesthesia: General endotracheal anesthesia and Local anesthesia 0.25.% bupivacaine, with epinephrine  ASA Class: 1  Procedure Details  The patient was seen again in the Holding Room. The risks, benefits, complications, treatment options, and expected outcomes were discussed with the patient and/or family. The possibilities of reaction to medication, pulmonary aspiration, perforation of viscus, bleeding, recurrent infection, finding a normal appendix, the need for additional procedures, failure to diagnose a condition, and creating a complication requiring transfusion or operation were discussed. There was concurrence with the proposed plan and informed consent was obtained. The site of surgery was properly noted/marked. The patient was taken to Operating Room, identified as Wayne Chang and the procedure verified as Appendectomy. A Time Out was held and the above information confirmed.  The patient was placed in the supine position and general anesthesia was induced, along with placement of orogastric tube, Venodyne boots, and a Foley catheter. The abdomen was prepped and draped in a sterile fashion. A one centimeter  infraumbilical incision was made and the peritoneal cavity was accessed using the OPEN  technique. The pneumoperitoneum was then established to steady pressure of 12 mmHg. A 12 mm port was placed through the umbilical incision. Additional 5 mm cannulas then placed in the left lower quadrant of the abdomen and half way between the umbilicus and pubic symphysis under direct vision. A careful evaluation of the entire abdomen was carried out. The patient was placed in Trendelenburg and left lateral decubitus position. The small intestines were retracted in the cephalad and left lateral direction away from the pelvis and right lower quadrant. The patient was found to have an enlarged and inflamed appendix that was extending into the pelvis. There was no evidence of perforation.  The appendix was carefully dissected. A window was made in the mesoappendix at the base of the appendix. A harmonic scalpel was used across the mesoappendix. The appendix was divided at its base using an endo-GIA stapler. Minimal appendiceal stump was left in place. There was no evidence of bleeding, leakage, or complication after division of the appendix. Irrigation was also performed and irrigate suctioned from the abdomen as well.  The umbilical port site was closed using 0 vicryl pursestring sutures fashion at the level of the fascia. The trocar site skin wounds were closed using skin staples.  Instrument, sponge, and needle counts were correct at the conclusion of the case.   Findings: The appendix was found to be inflamed. There were not signs of necrosis.  There was perforation. There was abscess formation.  Estimated Blood Loss:  Minimal         Drains: NONE         Total IV Fluids: 800 mL         Specimens: APPENDIX  Complications:  None; patient tolerated the procedure well.         Disposition: PACU - hemodynamically stable.         Condition: stable

## 2014-02-12 NOTE — Anesthesia Procedure Notes (Signed)
Procedure Name: Intubation Date/Time: 02/12/2014 12:25 AM Performed by: Melina Schools Pre-anesthesia Checklist: Patient identified, Emergency Drugs available, Suction available and Patient being monitored Patient Re-evaluated:Patient Re-evaluated prior to inductionOxygen Delivery Method: Circle system utilized Preoxygenation: Pre-oxygenation with 100% oxygen Intubation Type: IV induction, Rapid sequence and Cricoid Pressure applied Ventilation: Mask ventilation without difficulty Laryngoscope Size: Mac and 3 Grade View: Grade I Tube type: Oral Tube size: 8.0 mm Number of attempts: 1 Airway Equipment and Method: Stylet Placement Confirmation: ETT inserted through vocal cords under direct vision,  positive ETCO2 and breath sounds checked- equal and bilateral Secured at: 23 cm Tube secured with: Tape Dental Injury: Teeth and Oropharynx as per pre-operative assessment

## 2014-02-13 MED ORDER — METRONIDAZOLE 500 MG PO TABS: 500.0000 mg | ORAL_TABLET | Freq: Three times a day (TID) | ORAL | Status: AC

## 2014-02-13 MED ORDER — CIPROFLOXACIN HCL 500 MG PO TABS: 500.0000 mg | ORAL_TABLET | Freq: Two times a day (BID) | ORAL | Status: AC

## 2014-02-13 MED ORDER — OXYCODONE HCL 5 MG PO TABS: 5.0000 mg | ORAL_TABLET | ORAL | Status: AC | PRN

## 2014-02-13 MED ORDER — ACETAMINOPHEN 325 MG PO TABS: 650.0000 mg | ORAL_TABLET | Freq: Four times a day (QID) | ORAL | Status: AC | PRN

## 2014-02-13 NOTE — Discharge Instructions (Signed)

## 2014-02-13 NOTE — Discharge Summary (Signed)
Agree with above 

## 2014-02-13 NOTE — Discharge Summary (Signed)
Physician Discharge Summary  Patient ID: Wayne Chang MRN: [id number] DOB/AGE: May 14, 1976 38 y.o.  Admit date: 02/11/2014 Discharge date: 02/13/2014  Admitting Diagnosis: Acute appendicitis  Discharge Diagnosis Patient Active Problem List   Diagnosis Date Noted  . Appendicitis with peritoneal abscess 02/12/2014  . Acute appendicitis with peritoneal abscess 02/11/2014    Consultants none  Imaging: Ct Abdomen Pelvis W Contrast  02/11/2014   CLINICAL DATA:  Lower abdominal pain and cramping.  EXAM: CT ABDOMEN AND PELVIS WITH CONTRAST  TECHNIQUE: Multidetector CT imaging of the abdomen and pelvis was performed using the standard protocol following bolus administration of intravenous contrast.  CONTRAST:  OMNIPAQUE IOHEXOL 300 MG/ML  SOLN  COMPARISON:  None.  FINDINGS: The patient has acute appendicitis with extensive periappendiceal soft tissue inflammation with a small amount of fluid in the right pericolic gutter extending to the right side of the pelvis. There is a small fluid collection adjacent to the tip of inflamed appendix which may represent a small periappendiceal abscess.  Liver, biliary tree, spleen, pancreas, adrenal glands, and kidneys are normal. No dilated bowel. Bladder is normal. No acute osseous abnormality.  IMPRESSION: Acute appendicitis. There may be a 1 cm periappendiceal abscess adjacent to the tip of the appendix.   Electronically Signed   By: Geanie Cooley M.D.   On: 02/11/2014 22:36    Procedures Laparoscopic appendectomy---Dr. [facility] Hospital Course:  Wayne Chang is a healthy 38 year old male who presented to [facility] with RLQ abdominal pain.  Workup showed appendicitis with possible perforation.  Patient was admitted and underwent procedure listed above.  Tolerated procedure well and was transferred to the floor. He was found to have a peritoneal abscess and was therefore kept for an additional 24 hours for IV antibiotics.  Diet was advanced as  tolerated.  On POD#2, the patient was voiding well, tolerating diet, ambulating well, pain well controlled, vital signs stable, incisions c/d/i and felt stable for discharge home.  He was sent home with 5 days of PO cipro/flagyl to complete his course.  Medication risks, benefits and therapeutic alternatives were reviewed with the patient.  He verbalizes understanding.  He works as a Passenger transport manager heavy things and therefore he may return to work in 3 weeks, if able to accommodate, may return sooner.  Patient will follow up in our office in 3 weeks and knows to call with questions or concerns.  Physical Exam: General:  Alert, NAD, pleasant, comfortable Lungs: CTA Cardio: s1s2 rrr, no murmurs, gallops or rubs.  2/2 pulses, no edema.  Abd:  Soft, ND, mild tenderness, incisions C/D/I    Medication List         acetaminophen 325 MG tablet  Commonly known as:  TYLENOL  Take 2 tablets (650 mg total) by mouth every 6 (six) hours as needed for mild pain (or Temp > 100).     ciprofloxacin 500 MG tablet  Commonly known as:  CIPRO  Take 1 tablet (500 mg total) by mouth 2 (two) times daily.     metroNIDAZOLE 500 MG tablet  Commonly known as:  FLAGYL  Take 1 tablet (500 mg total) by mouth 3 (three) times daily.     oxyCODONE 5 MG immediate release tablet  Commonly known as:  Oxy IR/ROXICODONE  Take 1 tablet (5 mg total) by mouth every 4 (four) hours as needed for moderate pain.             Follow-up Information   Follow  up with [facility].   Contact information:   [address] [place] [address] [phone]       Signed: Ashok Norris, [facility] [phone]  02/13/2014, 8:44 AM

## 2014-02-13 NOTE — Progress Notes (Signed)
Wayne Chang to be D/C'd Home per MD order.  Discussed with the patient and all questions fully answered.  VSS, Skin clean, dry and intact without evidence of skin break down, no evidence of skin tears noted. Surgical incision sites clean, dry, intact with no evidence of infection.   IV catheter discontinued intact. Site without signs and symptoms of complications. Dressing and pressure applied.  An After Visit Summary was printed and given to the patient. Patient received prescriptions x3.  D/c education completed with patient/family including follow up instructions, medication list, d/c activities limitations if indicated, with other d/c instructions as indicated by MD - patient able to verbalize understanding, all questions fully answered.   Patient instructed to return to ED, call 911, or call MD for any changes in condition.   Patient escorted via WC, and D/C home via private auto.  [name] 02/13/2014 8:55 AM

## 2015-05-07 IMAGING — CT CT ABD-PELV W/ CM
2 of 4 series · 15 of 46 positions shown, 17 images · IV contrast (Omni 300)
Comparison: None.

CLINICAL DATA: Lower abdominal pain and cramping.

EXAM:
CT ABDOMEN AND PELVIS WITH CONTRAST
TECHNIQUE: Multidetector CT imaging of the abdomen and pelvis was performed
using the standard protocol following bolus administration of
intravenous contrast.
CONTRAST:  100mL OMNIPAQUE IOHEXOL 300 MG/ML  SOLN

[Series 2: abd/ pelvis 5.0 i30f 1 · axial · 0.70mm/px · z∈[+611,+1051]mm · 12 of 96 slices shown, 14 images]
[im 4/96  soft-tissue]
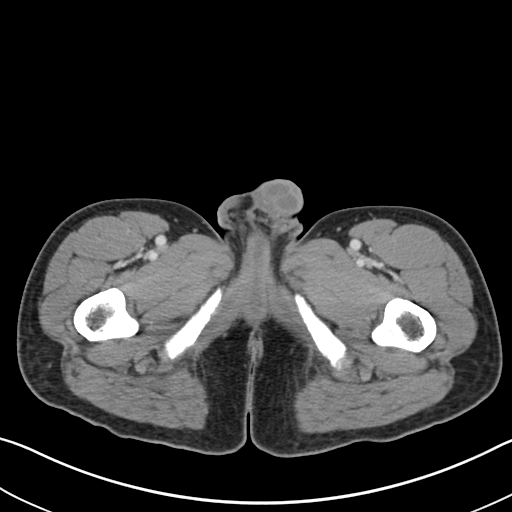
[im 4/96  bone]
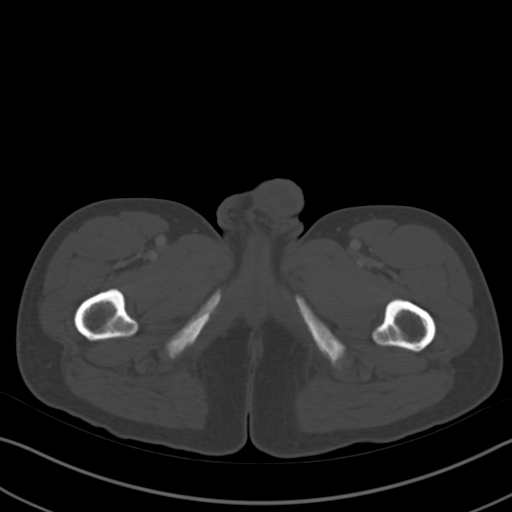
[im 12/96  soft-tissue]
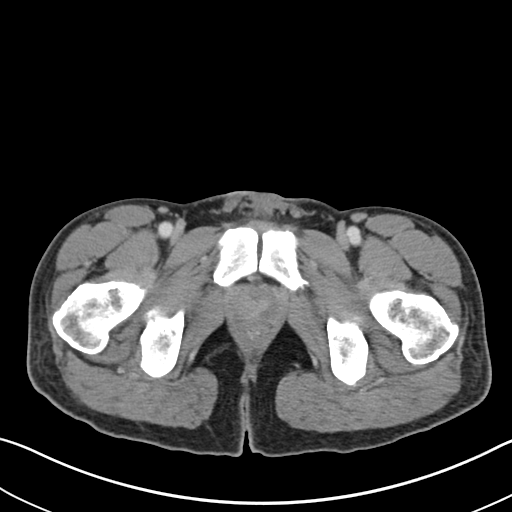
[im 20/96  soft-tissue]
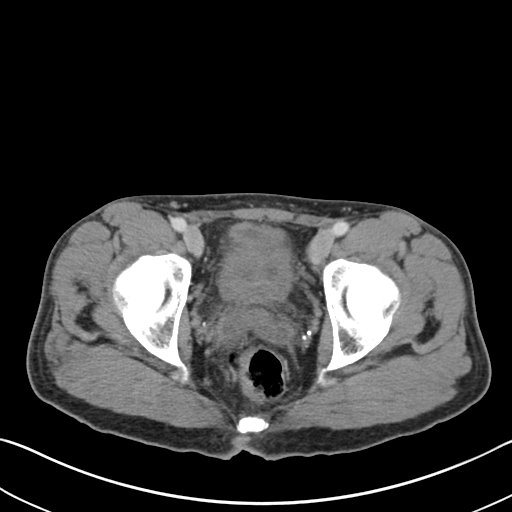
[im 28/96  soft-tissue]
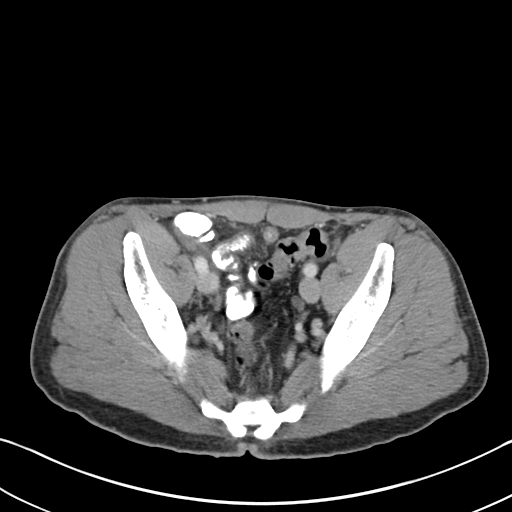
[im 36/96  soft-tissue]
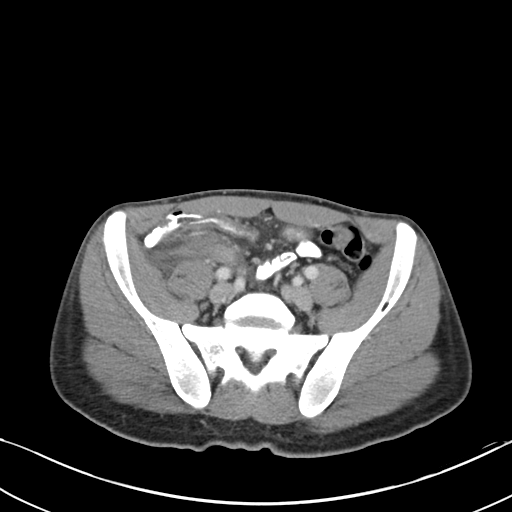
[im 44/96  soft-tissue]
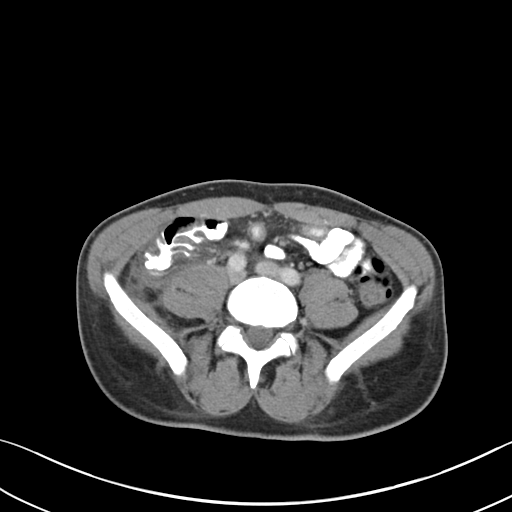
[im 52/96  soft-tissue]
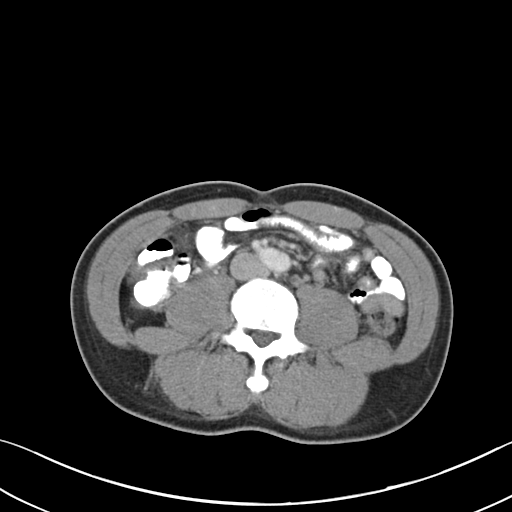
[im 60/96  soft-tissue]
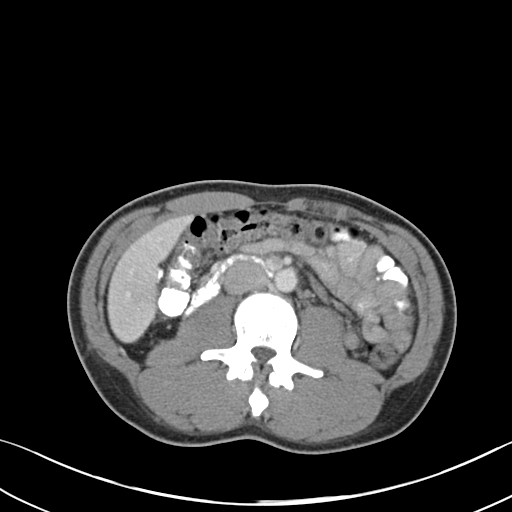
[im 68/96  soft-tissue]
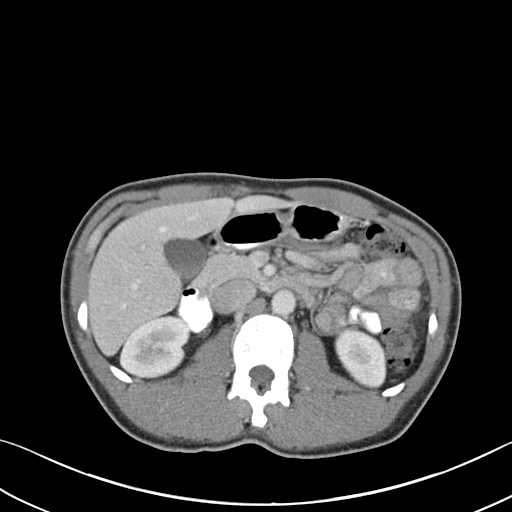
[im 68/96  bone]
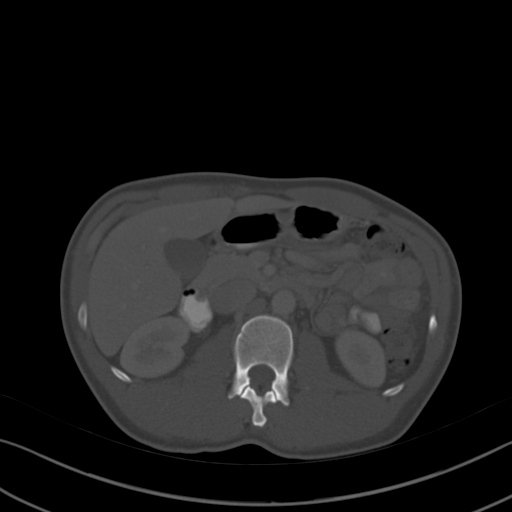
[im 76/96  soft-tissue]
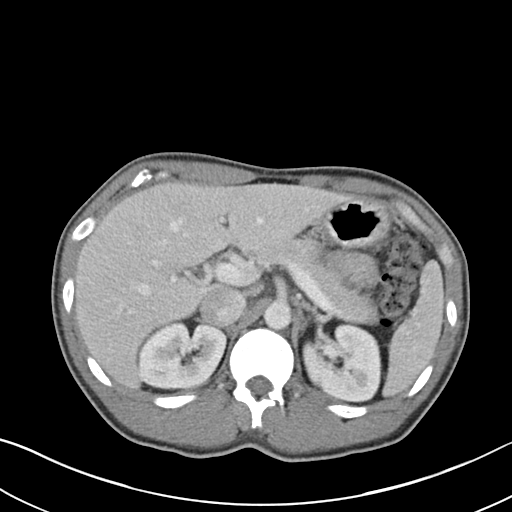
[im 84/96  soft-tissue]
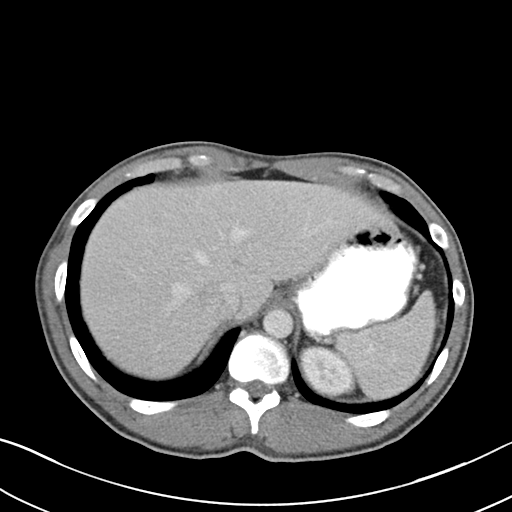
[im 92/96  soft-tissue]
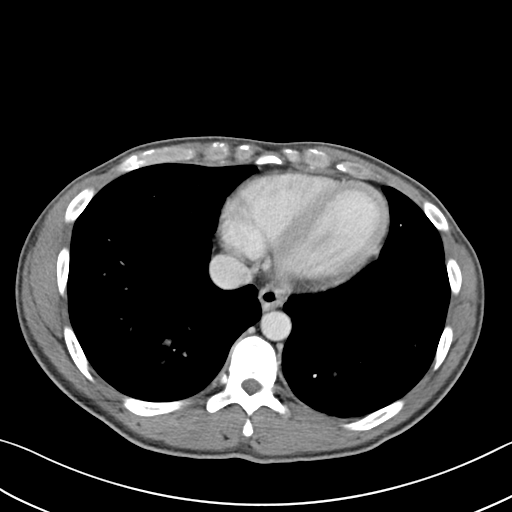

[Series 5: coronals · coronal · 0.54mm/px · 3 of 101 slices shown]
[im 34/101  soft-tissue]
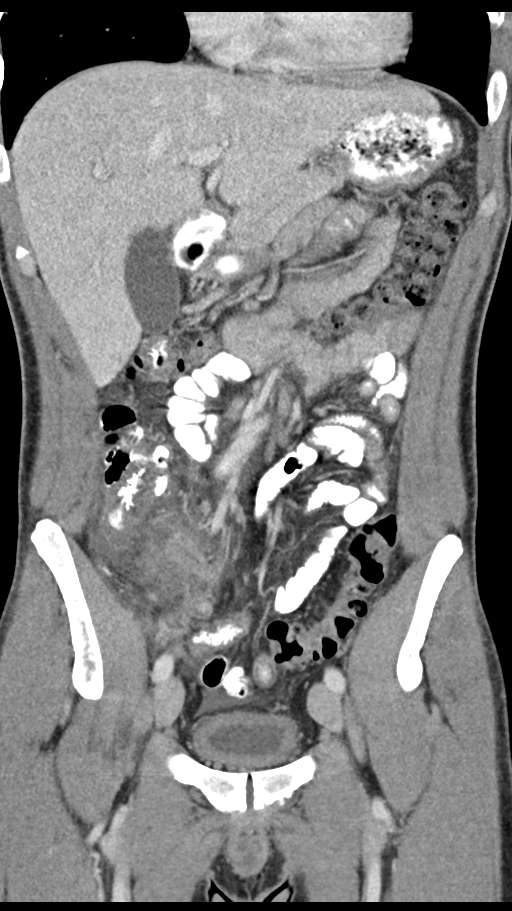
[im 45/101  soft-tissue]
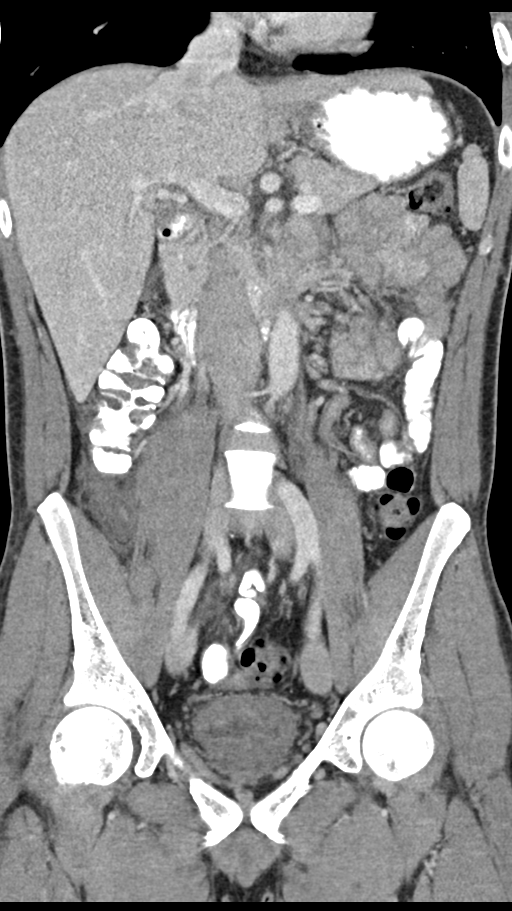
[im 56/101  soft-tissue]
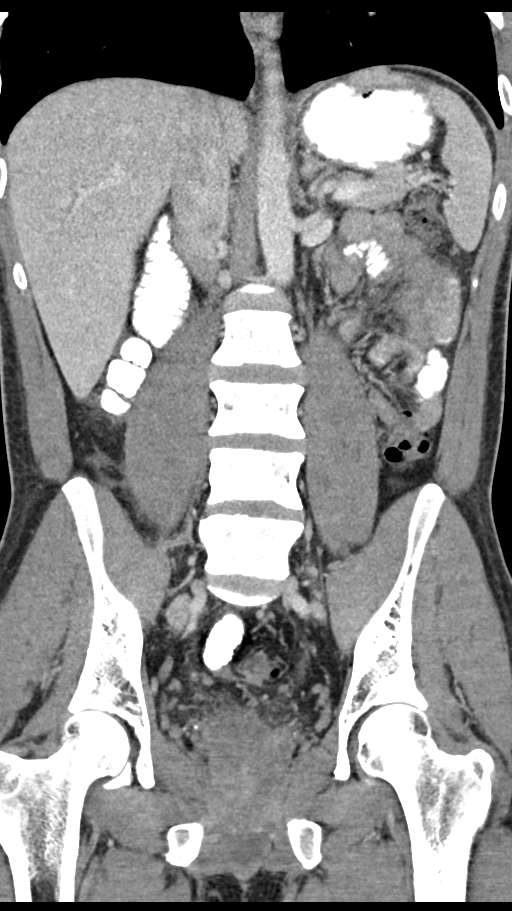

[15 of 46 positions shown; findings below may reference images not displayed]

FINDINGS: The patient has acute appendicitis with extensive periappendiceal
soft tissue inflammation with a small amount of fluid in the right
pericolic gutter extending to the right side of the pelvis. There is
a small fluid collection adjacent to the tip of inflamed appendix
which may represent a small periappendiceal abscess.

Liver, biliary tree, spleen, pancreas, adrenal glands, and kidneys
are normal. No dilated bowel. Bladder is normal. No acute osseous
abnormality.
IMPRESSION: Acute appendicitis. There may be a 1 cm periappendiceal abscess
adjacent to the tip of the appendix.

## 2020-10-08 ENCOUNTER — Ambulatory Visit (HOSPITAL_COMMUNITY): Admit: 2020-10-08 | Discharge status: Against Medical Advice | Payer: Self-pay
